# Patient Record
Sex: Female | Born: 1960
Health system: Southern US, Community
[De-identification: ages and names within clinical notes are randomized; demographics above are authoritative.]

## PROBLEM LIST (undated history)

## (undated) DIAGNOSIS — Z720 Tobacco use: Secondary | ICD-10-CM

## (undated) DIAGNOSIS — E785 Hyperlipidemia, unspecified: Secondary | ICD-10-CM

## (undated) DIAGNOSIS — E049 Nontoxic goiter, unspecified: Secondary | ICD-10-CM

## (undated) DIAGNOSIS — I10 Essential (primary) hypertension: Secondary | ICD-10-CM

## (undated) HISTORY — DX: Nontoxic goiter, unspecified: E04.9

## (undated) HISTORY — DX: Tobacco use: Z72.0

## (undated) HISTORY — DX: Morbid (severe) obesity due to excess calories: E66.01

## (undated) HISTORY — DX: Essential (primary) hypertension: I10

## (undated) HISTORY — PX: TUBAL LIGATION: SHX77

## (undated) HISTORY — DX: Hyperlipidemia, unspecified: E78.5

---

## 2019-01-10 DIAGNOSIS — I10 Essential (primary) hypertension: Secondary | ICD-10-CM | POA: Diagnosis not present

## 2019-01-10 DIAGNOSIS — M25571 Pain in right ankle and joints of right foot: Secondary | ICD-10-CM | POA: Diagnosis not present

## 2019-02-13 DIAGNOSIS — J0100 Acute maxillary sinusitis, unspecified: Secondary | ICD-10-CM | POA: Diagnosis not present

## 2019-05-15 DIAGNOSIS — Z91030 Bee allergy status: Secondary | ICD-10-CM | POA: Diagnosis not present

## 2019-05-15 DIAGNOSIS — Z23 Encounter for immunization: Secondary | ICD-10-CM | POA: Diagnosis not present

## 2019-05-15 DIAGNOSIS — I10 Essential (primary) hypertension: Secondary | ICD-10-CM | POA: Diagnosis not present

## 2019-05-15 DIAGNOSIS — E782 Mixed hyperlipidemia: Secondary | ICD-10-CM | POA: Diagnosis not present

## 2019-05-15 DIAGNOSIS — Z87898 Personal history of other specified conditions: Secondary | ICD-10-CM | POA: Diagnosis not present

## 2019-05-15 DIAGNOSIS — E041 Nontoxic single thyroid nodule: Secondary | ICD-10-CM | POA: Diagnosis not present

## 2019-05-29 DIAGNOSIS — E042 Nontoxic multinodular goiter: Secondary | ICD-10-CM | POA: Diagnosis not present

## 2019-06-07 DIAGNOSIS — Z1211 Encounter for screening for malignant neoplasm of colon: Secondary | ICD-10-CM | POA: Diagnosis not present

## 2019-06-07 DIAGNOSIS — Z01419 Encounter for gynecological examination (general) (routine) without abnormal findings: Secondary | ICD-10-CM | POA: Diagnosis not present

## 2019-09-04 DIAGNOSIS — Z117 Encounter for testing for latent tuberculosis infection: Secondary | ICD-10-CM | POA: Diagnosis not present

## 2019-09-04 DIAGNOSIS — E782 Mixed hyperlipidemia: Secondary | ICD-10-CM | POA: Diagnosis not present

## 2019-09-04 DIAGNOSIS — R7303 Prediabetes: Secondary | ICD-10-CM | POA: Diagnosis not present

## 2019-09-04 DIAGNOSIS — Z0000 Encounter for general adult medical examination without abnormal findings: Secondary | ICD-10-CM | POA: Diagnosis not present

## 2019-09-06 DIAGNOSIS — F17200 Nicotine dependence, unspecified, uncomplicated: Secondary | ICD-10-CM | POA: Diagnosis not present

## 2019-09-06 DIAGNOSIS — Z1231 Encounter for screening mammogram for malignant neoplasm of breast: Secondary | ICD-10-CM | POA: Diagnosis not present

## 2019-09-06 DIAGNOSIS — E2839 Other primary ovarian failure: Secondary | ICD-10-CM | POA: Diagnosis not present

## 2020-04-07 LAB — COLOGUARD: Cologuard: NEGATIVE

## 2020-05-08 ENCOUNTER — Other Ambulatory Visit: Payer: Self-pay

## 2020-05-08 ENCOUNTER — Encounter: Payer: Self-pay | Admitting: Internal Medicine

## 2020-05-08 ENCOUNTER — Ambulatory Visit (INDEPENDENT_AMBULATORY_CARE_PROVIDER_SITE_OTHER): Payer: BC Managed Care – PPO | Admitting: Internal Medicine

## 2020-05-08 ENCOUNTER — Encounter (INDEPENDENT_AMBULATORY_CARE_PROVIDER_SITE_OTHER): Payer: Self-pay

## 2020-05-08 VITALS — BP 110/84 | HR 81 | Temp 97.9°F | Ht 64.5 in | Wt 229.5 lb

## 2020-05-08 DIAGNOSIS — E785 Hyperlipidemia, unspecified: Secondary | ICD-10-CM

## 2020-05-08 DIAGNOSIS — E049 Nontoxic goiter, unspecified: Secondary | ICD-10-CM

## 2020-05-08 DIAGNOSIS — Z124 Encounter for screening for malignant neoplasm of cervix: Secondary | ICD-10-CM

## 2020-05-08 DIAGNOSIS — I10 Essential (primary) hypertension: Secondary | ICD-10-CM

## 2020-05-08 DIAGNOSIS — Z720 Tobacco use: Secondary | ICD-10-CM | POA: Diagnosis not present

## 2020-05-08 MED ORDER — BUPROPION HCL ER (XL) 150 MG PO TB24: 150.0000 mg | ORAL_TABLET | Freq: Every day | ORAL | 1 refills | Status: AC

## 2020-05-08 NOTE — Progress Notes (Signed)
New Patient Office Visit     This visit occurred during the SARS-CoV-2 public health emergency.  Safety protocols were in place, including screening questions prior to the visit, additional usage of staff PPE, and extensive cleaning of exam room while observing appropriate contact time as indicated for disinfecting solutions.    CC/Reason for Visit: Establish care, discuss chronic medical conditions Previous PCP: In [place], [place] Last Visit: November 2020  HPI: Christina Vaughan is a 59 y.o. female who is coming in today for the above mentioned reasons. Past Medical History is significant for: Hypertension, hyperlipidemia, ongoing tobacco abuse, thyroid goiter, vitamin D deficiency.  She recently moved from [facility] for job purposes.  She has 2 grown children.  She smokes about 10 cigarettes a day and has done so for around 10 years, drinks alcohol occasionally, no known drug allergies, her past surgical history is only significant for a tubal ligation.  Her family history is mostly significant for 5 siblings with thyroid cancer status post thyroidectomy.  Because of this history and her thyroid goiter she has annual ultrasounds which she is due for.  She also had a sister with breast cancer and another sister with lupus.  She has had all age-appropriate vaccinations.  She had a mammogram last October that was negative, she did a Cologuard earlier this year that was negative.  She is quite interested in smoking cessation today.   Past Medical/Surgical History: Past Medical History:  Diagnosis Date  . Hyperlipidemia   . Hypertension   . Morbid obesity (HCC)   . Thyroid goiter   . Tobacco abuse     Past Surgical History:  Procedure Laterality Date  . TUBAL LIGATION      Social History:  reports that she has been smoking. She has never used smokeless tobacco. She reports current alcohol use. She reports that she does not use drugs.  Allergies: No Known Allergies  Family  History:  Please see HPI for details.  Current Outpatient Medications:  .  ascorbic acid (VITAMIN C) 1000 MG tablet, Take by mouth., Disp: , Rfl:  .  Cholecalciferol 1.25 MG (50000 UT) capsule, cholecalciferol (vitamin D3) 1,250 mcg (50,000 unit) capsule  TK 1 C PO WEEKLY FOR 8 WKS, Disp: , Rfl:  .  Cyanocobalamin 5000 MCG TBDP, Take by mouth., Disp: , Rfl:  .  EPINEPHrine 0.3 mg/0.3 mL IJ SOAJ injection, epinephrine 0.3 mg/0.3 mL injection, auto-injector  INJECT 0.3 ML INTO THE MUSCLE ONCE AS NEEDED FOR ANAPHYLAXIS., Disp: , Rfl:  .  fluticasone (FLONASE) 50 MCG/ACT nasal spray, fluticasone propionate 50 mcg/actuation nasal spray,suspension  SHAKE LIQUID AND USE 1 SPRAY IN EACH NOSTRIL EVERY DAY AS NEEDED, Disp: , Rfl:  .  Ginger, Zingiber officinalis, (GINGER PO), Take by mouth., Disp: , Rfl:  .  loratadine (CLARITIN) 10 MG tablet, loratadine 10 mg tablet, Disp: , Rfl:  .  OIL OF OREGANO PO, Take by mouth., Disp: , Rfl:  .  pravastatin (PRAVACHOL) 20 MG tablet, Take 20 mg by mouth at bedtime., Disp: , Rfl:  .  telmisartan-hydrochlorothiazide (MICARDIS HCT) 80-25 MG tablet, telmisartan 80 mg-hydrochlorothiazide 25 mg tablet  TAKE 1 TABLET BY MOUTH DAILY, Disp: , Rfl:  .  TURMERIC PO, Take by mouth., Disp: , Rfl:  .  Zinc 100 MG TABS, Take by mouth., Disp: , Rfl:  .  buPROPion (WELLBUTRIN XL) 150 MG 24 hr tablet, Take 1 tablet (150 mg total) by mouth daily., Disp: 90 tablet, Rfl: 1  Review of Systems:  Constitutional: Denies fever, chills, diaphoresis, appetite change and fatigue.  HEENT: Denies photophobia, eye pain, redness, hearing loss, ear pain, congestion, sore throat, rhinorrhea, sneezing, mouth sores, trouble swallowing, neck pain, neck stiffness and tinnitus.   Respiratory: Denies SOB, DOE, cough, chest tightness,  and wheezing.   Cardiovascular: Denies chest pain, palpitations and leg swelling.  Gastrointestinal: Denies nausea, vomiting, abdominal pain, diarrhea, constipation, blood  in stool and abdominal distention.  Genitourinary: Denies dysuria, urgency, frequency, hematuria, flank pain and difficulty urinating.  Endocrine: Denies: hot or cold intolerance, sweats, changes in hair or nails, polyuria, polydipsia. Musculoskeletal: Denies myalgias, back pain, joint swelling, arthralgias and gait problem.  Skin: Denies pallor, rash and wound.  Neurological: Denies dizziness, seizures, syncope, weakness, light-headedness, numbness and headaches.  Hematological: Denies adenopathy. Easy bruising, personal or family bleeding history  Psychiatric/Behavioral: Denies suicidal ideation, mood changes, confusion, nervousness, sleep disturbance and agitation    Physical Exam: Vitals:   05/08/20 0807  BP: 110/84  Pulse: 81  Temp: 97.9 F (36.6 C)  TempSrc: Oral  SpO2: 97%  Weight: 229 lb 8 oz (104.1 kg)  Height: 5' 4.5" (1.638 m)   Body mass index is 38.79 kg/m.  Constitutional: NAD, calm, comfortable, obese Eyes: PERRL, lids and conjunctivae normal, wears corrective lenses ENMT: Mucous membranes are moist.  Respiratory: clear to auscultation bilaterally, no wheezing, no crackles. Normal respiratory effort. No accessory muscle use.  Cardiovascular: Regular rate and rhythm, no murmurs / rubs / gallops. No extremity edema. .  Neurologic: Grossly intact and nonfocal Psychiatric: Normal judgment and insight. Alert and oriented x 3. Normal mood.    Impression and Plan:  Cervical cancer screening  - Plan: Ambulatory referral to Obstetrics / Gynecology  Essential hypertension -Well-controlled on current medication.  Hyperlipidemia, unspecified hyperlipidemia type -On statin, check lipids when she returns for CPE.  Thyroid goiter  - Plan: US THYROID -Impressive thyroid cancer history in her family.  Morbid obesity (HCC) -Discussed healthy lifestyle, including increased physical activity and better food choices to promote weight loss.  Tobacco abuse  -I have  discussed tobacco cessation with the patient.  I have counseled the patient regarding the negative impacts of continued tobacco use including but not limited to lung cancer, COPD, and cardiovascular disease.  I have discussed alternatives to tobacco and modalities that may help facilitate tobacco cessation including but not limited to biofeedback, hypnosis, and medications.  Total time spent with tobacco counseling was 6 minutes. -She is in the action phase.  She would like to try Wellbutrin.  She will also schedule CBT sessions and consider hypnosis. -She will follow-up with me in 6 weeks.     Patient Instructions  -Nice seeing you today!!  -Start wellbutrin 150 mg daily.  -Schedule follow up in 6 weeks.  -Consider therapy sessions.       Chaya Jan, MD [facility]

## 2020-05-08 NOTE — Patient Instructions (Signed)
-  Nice seeing you today!!  -Start wellbutrin 150 mg daily.  -Schedule follow up in 6 weeks.  -Consider therapy sessions.

## 2020-06-06 ENCOUNTER — Other Ambulatory Visit: Payer: Self-pay

## 2020-06-06 ENCOUNTER — Ambulatory Visit: Payer: BC Managed Care – PPO | Admitting: Nurse Practitioner

## 2020-06-06 ENCOUNTER — Encounter: Payer: Self-pay | Admitting: Nurse Practitioner

## 2020-06-06 VITALS — BP 110/82 | Ht 64.0 in | Wt 229.0 lb

## 2020-06-06 DIAGNOSIS — Z780 Asymptomatic menopausal state: Secondary | ICD-10-CM

## 2020-06-06 DIAGNOSIS — Z01419 Encounter for gynecological examination (general) (routine) without abnormal findings: Secondary | ICD-10-CM | POA: Diagnosis not present

## 2020-06-06 DIAGNOSIS — Z9851 Tubal ligation status: Secondary | ICD-10-CM | POA: Insufficient documentation

## 2020-06-06 NOTE — Progress Notes (Signed)
   Christina Vaughan. Novosel 1961-02-03 [id number]   History:  59 y.o. [id number] presents as new patient to establish care without GYN complaints. BTL. Postmenopausal - no HRT, no bleeding. Abnormal pap many years ago, cryo done, subsequent normal. Normal mammogram history. Significant family history of thyroid cancer, ultrasound performed every 2 years. Not sexually active. Sister diagnosed with breast cancer 1.5 years ago.   Gynecologic History No LMP recorded. Patient is postmenopausal.   Last Pap: 06/2019. Results were: normal Last mammogram: 07/2019. Results were: normal Last colonoscopy: Never. Negative cologuard 2021 Last Dexa: 09/06/2019. Results were: normal  Past medical history, past surgical history, family history and social history were all reviewed and documented in the EPIC chart.  ROS:  A ROS was performed and pertinent positives and negatives are included.  Exam:  Vitals:   06/06/20 0904  BP: 110/82  Weight: 229 lb (103.9 kg)  Height: 5\' 4"  (1.626 m)   Body mass index is 39.31 kg/m.  General appearance:  Normal Thyroid:  Symmetrical, normal in size, without palpable masses or nodularity. Respiratory  Auscultation:  Clear without wheezing or rhonchi Cardiovascular  Auscultation:  Regular rate, without rubs, murmurs or gallops  Edema/varicosities:  Not grossly evident Abdominal  Soft,nontender, without masses, guarding or rebound.  Liver/spleen:  No organomegaly noted  Hernia:  None appreciated  Skin  Inspection:  Grossly normal   Breasts: Examined lying and sitting.   Right: Without masses, retractions, discharge or axillary adenopathy.   Left: Without masses, retractions, discharge or axillary adenopathy. Gentitourinary   Inguinal/mons:  Normal without inguinal adenopathy  External genitalia:  Normal  BUS/Urethra/Skene's glands:  Normal  Vagina:  Normal  Cervix:  Normal  Uterus:  Difficult to palpate due to body habitus but no gross masses or  tenderness  Adnexa/parametria:     Rt: Without masses or tenderness.   Lt: Without masses or tenderness.  Anus and perineum: Normal  Digital rectal exam: Normal sphincter tone without palpated masses or tenderness  Assessment/Plan:  59 y.o. 66 to establish care.   Well female exam with routine gynecological exam - Education provided on SBEs, importance of preventative screenings, current guidelines, high calcium diet, regular exercise, and multivitamin daily. Labs done with primary care.   Postmenopausal - no HRT, no bleeding.   Screening for breast cancer - Normal mammogram history. Most recent October 2020. Sister diagnosed with breast cancer 1.5 years ago.   Follow up in 1 year for annual       November 2020 [facility], 9:13 AM 06/06/2020

## 2020-06-06 NOTE — Patient Instructions (Signed)

## 2020-06-15 ENCOUNTER — Ambulatory Visit (HOSPITAL_COMMUNITY)
Admission: EM | Admit: 2020-06-15 | Discharge: 2020-06-15 | Disposition: A | Discharge status: Home / Self Care | Payer: BC Managed Care – PPO | Attending: Family Medicine | Admitting: Family Medicine

## 2020-06-15 ENCOUNTER — Ambulatory Visit (INDEPENDENT_AMBULATORY_CARE_PROVIDER_SITE_OTHER): Payer: BC Managed Care – PPO

## 2020-06-15 ENCOUNTER — Other Ambulatory Visit: Payer: Self-pay

## 2020-06-15 ENCOUNTER — Encounter (HOSPITAL_COMMUNITY): Payer: Self-pay

## 2020-06-15 DIAGNOSIS — S4991XA Unspecified injury of right shoulder and upper arm, initial encounter: Secondary | ICD-10-CM

## 2020-06-15 DIAGNOSIS — M25511 Pain in right shoulder: Secondary | ICD-10-CM | POA: Diagnosis not present

## 2020-06-15 DIAGNOSIS — M19011 Primary osteoarthritis, right shoulder: Secondary | ICD-10-CM | POA: Diagnosis not present

## 2020-06-15 MED ORDER — KETOROLAC TROMETHAMINE 60 MG/2ML IM SOLN
60.0000 mg | Freq: Once | INTRAMUSCULAR | Status: AC
  Administered 2020-06-15: 60 mg via INTRAMUSCULAR

## 2020-06-15 MED ORDER — KETOROLAC TROMETHAMINE 60 MG/2ML IM SOLN
INTRAMUSCULAR | Status: AC
  Filled 2020-06-15: qty 2

## 2020-06-15 NOTE — Discharge Instructions (Signed)
May use voltaren gel and tylenol as needed for pain relief. Follow up with Orthopedics in about a week  [facility] [phone]
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
## 2020-06-18 ENCOUNTER — Other Ambulatory Visit: Payer: Self-pay

## 2020-06-19 ENCOUNTER — Encounter: Payer: Self-pay | Admitting: Internal Medicine

## 2020-06-19 ENCOUNTER — Ambulatory Visit: Payer: BC Managed Care – PPO | Admitting: Internal Medicine

## 2020-06-19 VITALS — BP 120/84 | HR 78 | Temp 98.2°F | Wt 232.0 lb

## 2020-06-19 DIAGNOSIS — I10 Essential (primary) hypertension: Secondary | ICD-10-CM

## 2020-06-19 DIAGNOSIS — Z23 Encounter for immunization: Secondary | ICD-10-CM

## 2020-06-19 DIAGNOSIS — M79601 Pain in right arm: Secondary | ICD-10-CM | POA: Diagnosis not present

## 2020-06-19 DIAGNOSIS — Z720 Tobacco use: Secondary | ICD-10-CM

## 2020-06-19 NOTE — Addendum Note (Signed)
Addended by: Kern Reap B on: 06/19/2020 05:26 PM   Modules accepted: Orders

## 2020-06-19 NOTE — Patient Instructions (Signed)
-  Nice seeing you today!!  -Resume Wellbutrin 150 mg daily.  -We will schedule you to see an orthopedist for your arm.  -Schedule follow up in 3 months.
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
## 2020-06-20 ENCOUNTER — Ambulatory Visit (INDEPENDENT_AMBULATORY_CARE_PROVIDER_SITE_OTHER): Payer: BC Managed Care – PPO | Admitting: Orthopaedic Surgery

## 2020-06-20 ENCOUNTER — Other Ambulatory Visit: Payer: Self-pay

## 2020-06-20 ENCOUNTER — Encounter: Payer: Self-pay | Admitting: Orthopaedic Surgery

## 2020-06-20 DIAGNOSIS — M25511 Pain in right shoulder: Secondary | ICD-10-CM | POA: Diagnosis not present

## 2020-06-20 MED ORDER — TRAMADOL HCL 50 MG PO TABS: ORAL_TABLET | ORAL | 0 refills | Status: AC
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
## 2020-06-21 ENCOUNTER — Other Ambulatory Visit: Payer: BC Managed Care – PPO

## 2020-06-24 ENCOUNTER — Telehealth: Payer: Self-pay

## 2020-06-24 NOTE — Telephone Encounter (Signed)
Please see below.

## 2020-06-24 NOTE — Telephone Encounter (Signed)
Patient called in advising her mri appt was canceled because facility never received ref from [place] .

## 2020-06-26 NOTE — Telephone Encounter (Signed)
Refaxed referral to [facility], they will contact pt to schedule appt

## 2020-07-01 ENCOUNTER — Encounter: Payer: Self-pay | Admitting: Orthopaedic Surgery

## 2020-07-01 ENCOUNTER — Encounter: Payer: Self-pay | Admitting: Internal Medicine

## 2020-07-02 ENCOUNTER — Ambulatory Visit
Admission: RE | Admit: 2020-07-02 | Discharge: 2020-07-02 | Disposition: A | Discharge status: Home / Self Care | Payer: BC Managed Care – PPO | Source: Ambulatory Visit | Attending: Orthopaedic Surgery | Admitting: Orthopaedic Surgery

## 2020-07-02 DIAGNOSIS — M25511 Pain in right shoulder: Secondary | ICD-10-CM | POA: Diagnosis not present

## 2020-07-02 NOTE — Telephone Encounter (Signed)
Pt is scheduled for later today for MRI

## 2020-07-09 ENCOUNTER — Encounter: Payer: Self-pay | Admitting: Orthopaedic Surgery

## 2020-07-09 ENCOUNTER — Ambulatory Visit: Payer: BC Managed Care – PPO | Admitting: Orthopaedic Surgery

## 2020-07-09 ENCOUNTER — Other Ambulatory Visit: Payer: Self-pay

## 2020-07-09 VITALS — Ht 64.0 in | Wt 232.0 lb

## 2020-07-09 DIAGNOSIS — M25511 Pain in right shoulder: Secondary | ICD-10-CM

## 2020-07-09 NOTE — Progress Notes (Addendum)
Office Visit Note   Patient: Christina Vaughan           Date of Birth: 02-14-1961           MRN: [id number] Visit Date: 07/09/2020              Requested by: Philip Aspen, Limmie Patricia, MD [address] [place],  [place] [address] PCP: Philip Aspen, Limmie Patricia, MD   Assessment & Plan: Visit Diagnoses:  1. Acute pain of right shoulder     Plan: Ms. Balliet is about 1 month post injury to her right shoulder.  Because of her discomfort I ordered an MRI scan and she is here for the results.  The scan demonstrated completely torn supra and infraspinatus with about 2 to 3 cm of retraction.  Muscles were normal without atrophy or focal lesions.  There was tendinopathy of the intra-articular segment of the biceps long head.  Moderate osteoarthritis of the AC joint and a type II acromium.  There was fluid in the subacromial and subdeltoid bursa.  Glenohumeral joint was negative.  Superior labrum was frayed and degenerative without a discrete tear.  No bony abnormalities or contusions.  Long discussion with Mrs. Aispuro regarding her scan.  She is feeling some pain in the mid humerus with shoulder motion and I really believe that the problem with her mid arm is referred from the shoulder.  She does have limited range of motion and pain with motion.  I think the best approach would be an arthroscopic SCD, DCR and mini open rotator cuff tear repair and possibly a biceps tenodesis and possibly a derma span patch.  I discussed the surgery with her including the outpatient nature of the incisions and their potential success in terms of pain relief and function.  I am concerned because both infra and supraspinatus tendons are torn with some retraction.  Hopefully we can retrieve them and after rehab reattach them.  She may have less than ideal function afterwards pending the findings and subsequent results.  She might have an early adhesive capsulitis which we can determine at the time of surgery.  She  does smoke and have encouraged her to a "cut back".  We will plan to do the surgery sometime this month as I had like to avoid contraction of the tendons.  All questions were answered.  Follow-Up Instructions: Return We will schedule rotator cuff tear repair right shoulder.   Orders:  No orders of the defined types were placed in this encounter.  No orders of the defined types were placed in this encounter.     Procedures: No procedures performed   Clinical Data: No additional findings.   Subjective: Chief Complaint  Patient presents with  . Right Shoulder - Follow-up    MRI review  Patient presents today for follow up on her right shoulder. She had an MRI done on 07/02/2020 and is here today for those results.  Approximately a month post injury to the right shoulder  HPI  Review of Systems   Objective: Vital Signs: Ht 5\' 4"  (1.626 m)   Wt 232 lb (105.2 kg)   BMI 39.82 kg/m   Physical Exam Constitutional:      Appearance: She is well-developed.  Eyes:     Pupils: Pupils are equal, round, and reactive to light.  Pulmonary:     Effort: Pulmonary effort is normal.  Skin:    General: Skin is warm and dry.  Neurological:  Mental Status: She is alert and oriented to person, place, and time.  Psychiatric:        Behavior: Behavior normal.     Ortho Exam awake alert and oriented x3.  Comfortable sitting.  No use of sling.  Having difficulty with active motion of her right shoulder with only about 70 degrees of active flexion and abduction.  I could bring her arm over her head but still lacking about 30 to 40 degrees of full overhead motion.  I am not sure if she was limited by pain or if she has developed an early adhesive capsulitis.  She did have positive empty can testing and impingement testing.  Skin was intact.  Biceps tendon appears to be intact.  She has been experiencing some popping and clicking but I could not reproduce that.  There was some tenderness in the  anterior subacromial region even laterally.  Certainly has pain referred to the mid arm with motion of her shoulder.  Good grip and release  Specialty Comments:  No specialty comments available.  Imaging: No results found.   PMFS History: Patient Active Problem List   Diagnosis Date Noted  . Pain in right shoulder 06/20/2020  . History of bilateral tubal ligation 06/06/2020  . Hypertension   . Hyperlipidemia   . Thyroid goiter   . Morbid obesity (HCC)   . Tobacco abuse    Past Medical History:  Diagnosis Date  . Hyperlipidemia   . Hypertension   . Morbid obesity (HCC)   . Thyroid goiter   . Tobacco abuse     Family History  Problem Relation Age of Onset  . Cancer Father     Past Surgical History:  Procedure Laterality Date  . TUBAL LIGATION     Social History   Occupational History  . Not on file  Tobacco Use  . Smoking status: Current Every Day Smoker    Packs/day: 0.25  . Smokeless tobacco: Never Used  Vaping Use  . Vaping Use: Never used  Substance and Sexual Activity  . Alcohol use: Yes    Comment: occasional  . Drug use: Never  . Sexual activity: Not Currently

## 2020-07-22 ENCOUNTER — Encounter: Payer: Self-pay | Admitting: Orthopaedic Surgery

## 2020-07-22 ENCOUNTER — Encounter: Payer: Self-pay | Admitting: Internal Medicine

## 2020-07-22 DIAGNOSIS — M79646 Pain in unspecified finger(s): Secondary | ICD-10-CM

## 2020-07-23 ENCOUNTER — Telehealth (INDEPENDENT_AMBULATORY_CARE_PROVIDER_SITE_OTHER): Payer: BC Managed Care – PPO | Admitting: Internal Medicine

## 2020-07-23 ENCOUNTER — Encounter: Payer: Self-pay | Admitting: Internal Medicine

## 2020-07-23 DIAGNOSIS — M25511 Pain in right shoulder: Secondary | ICD-10-CM

## 2020-07-23 DIAGNOSIS — E049 Nontoxic goiter, unspecified: Secondary | ICD-10-CM

## 2020-07-23 DIAGNOSIS — Z720 Tobacco use: Secondary | ICD-10-CM

## 2020-07-23 NOTE — Progress Notes (Signed)
Virtual Visit via Video Note  I connected with Christina Ramming. Vaughan on 07/23/20 at  3:45 PM EDT by a video enabled telemedicine application and verified that I am speaking with the correct person using two identifiers.  Location patient: home Location provider: work office Persons participating in the virtual visit: patient, provider  I discussed the limitations of evaluation and management by telemedicine and the availability of in person appointments. The patient expressed understanding and agreed to proceed.   HPI: She has scheduled this visit to update me on her orthopedic issues as well as to discuss smoking cessation.  She saw orthopedics and had an MRI and was diagnosed with some right rotator cuff issues.  She has an appointment scheduled for surgery coming soon but she has been somewhat unhappy with communication with the orthopedist and was requesting a referral to another practice for second opinion.  She has already scheduled this appointment.  We also discussed smoking cessation.  At last visit she was started on Wellbutrin 150 mg daily.  She has been able to cut down to 1 to 2 cigarettes a week.  She has learned to recognize social and environmental triggers and can manage these most of the time.  She feels like she is tolerating the Wellbutrin well.   ROS: Constitutional: Denies fever, chills, diaphoresis, appetite change and fatigue.  HEENT: Denies photophobia, eye pain, redness, hearing loss, ear pain, congestion, sore throat, rhinorrhea, sneezing, mouth sores, trouble swallowing, neck pain, neck stiffness and tinnitus.   Respiratory: Denies SOB, DOE, cough, chest tightness,  and wheezing.   Cardiovascular: Denies chest pain, palpitations and leg swelling.  Gastrointestinal: Denies nausea, vomiting, abdominal pain, diarrhea, constipation, blood in stool and abdominal distention.  Genitourinary: Denies dysuria, urgency, frequency, hematuria, flank pain and difficulty  urinating.  Endocrine: Denies: hot or cold intolerance, sweats, changes in hair or nails, polyuria, polydipsia. Musculoskeletal: Denies myalgias, back pain, joint swelling, arthralgias and gait problem.  Skin: Denies pallor, rash and wound.  Neurological: Denies dizziness, seizures, syncope, weakness, light-headedness, numbness and headaches.  Hematological: Denies adenopathy. Easy bruising, personal or family bleeding history  Psychiatric/Behavioral: Denies suicidal ideation, mood changes, confusion, nervousness, sleep disturbance and agitation   Past Medical History:  Diagnosis Date  . Hyperlipidemia   . Hypertension   . Morbid obesity (HCC)   . Thyroid goiter   . Tobacco abuse     Past Surgical History:  Procedure Laterality Date  . TUBAL LIGATION      Family History  Problem Relation Age of Onset  . Cancer Father     SOCIAL HX:   reports that she has been smoking. She has been smoking about 0.25 packs per day. She has never used smokeless tobacco. She reports current alcohol use. She reports that she does not use drugs.   Current Outpatient Medications:  .  ascorbic acid (VITAMIN C) 1000 MG tablet, Take by mouth., Disp: , Rfl:  .  buPROPion (WELLBUTRIN XL) 150 MG 24 hr tablet, Take 1 tablet (150 mg total) by mouth daily., Disp: 90 tablet, Rfl: 1 .  Cholecalciferol 1.25 MG (50000 UT) capsule, cholecalciferol (vitamin D3) 1,250 mcg (50,000 unit) capsule  TK 1 C PO WEEKLY FOR 8 WKS, Disp: , Rfl:  .  Cyanocobalamin 5000 MCG TBDP, Take by mouth., Disp: , Rfl:  .  EPINEPHrine 0.3 mg/0.3 mL IJ SOAJ injection, epinephrine 0.3 mg/0.3 mL injection, auto-injector  INJECT 0.3 ML INTO THE MUSCLE ONCE AS NEEDED FOR ANAPHYLAXIS., Disp: , Rfl:  .  fluticasone (FLONASE) 50 MCG/ACT nasal spray, fluticasone propionate 50 mcg/actuation nasal spray,suspension  SHAKE LIQUID AND USE 1 SPRAY IN EACH NOSTRIL EVERY DAY AS NEEDED, Disp: , Rfl:  .  Ginger, Zingiber officinalis, (GINGER PO), Take by  mouth., Disp: , Rfl:  .  loratadine (CLARITIN) 10 MG tablet, loratadine 10 mg tablet, Disp: , Rfl:  .  OIL OF OREGANO PO, Take by mouth., Disp: , Rfl:  .  pravastatin (PRAVACHOL) 20 MG tablet, Take 20 mg by mouth at bedtime., Disp: , Rfl:  .  telmisartan-hydrochlorothiazide (MICARDIS HCT) 80-25 MG tablet, telmisartan 80 mg-hydrochlorothiazide 25 mg tablet  TAKE 1 TABLET BY MOUTH DAILY, Disp: , Rfl:  .  traMADol (ULTRAM) 50 MG tablet, Take 2 tablets by mouth q HS prn., Disp: 30 tablet, Rfl: 0 .  TURMERIC PO, Take by mouth., Disp: , Rfl:  .  Zinc 100 MG TABS, Take by mouth., Disp: , Rfl:   EXAM:   VITALS per patient if applicable: None reported  GENERAL: alert, oriented, appears well and in no acute distress  HEENT: atraumatic, conjunttiva clear, no obvious abnormalities on inspection of external nose and ears  NECK: normal movements of the head and neck  LUNGS: on inspection no signs of respiratory distress, breathing rate appears normal, no obvious gross increased work of breathing, gasping or wheezing  CV: no obvious cyanosis  MS: moves all visible extremities without noticeable abnormality  PSYCH/NEURO: pleasant and cooperative, no obvious depression or anxiety, speech and thought processing grossly intact  ASSESSMENT AND PLAN:   Acute pain of right shoulder -She will update me after she gets her second opinion in regards to her surgery. -I will defer to orthopedics in regards to this issue.  Thyroid goiter -Her thyroid ultrasound is coming up soon.  She has yearly ultrasounds due to her goiter and multiple family members with thyroid cancer.  Tobacco abuse -She continues on Wellbutrin, she is tolerating it well, she has been able to cut down to 1 to 2 cigarettes a week.  Have encouraged her on her progress.     I discussed the assessment and treatment plan with the patient. The patient was provided an opportunity to ask questions and all were answered. The patient agreed  with the plan and demonstrated an understanding of the instructions.   The patient was advised to call back or seek an in-person evaluation if the symptoms worsen or if the condition fails to improve as anticipated.    Chaya Jan, MD  [facility]

## 2020-07-26 ENCOUNTER — Encounter: Payer: Self-pay | Admitting: Orthopaedic Surgery

## 2020-07-26 DIAGNOSIS — M75121 Complete rotator cuff tear or rupture of right shoulder, not specified as traumatic: Secondary | ICD-10-CM | POA: Diagnosis not present

## 2020-07-26 DIAGNOSIS — M65311 Trigger thumb, right thumb: Secondary | ICD-10-CM | POA: Diagnosis not present

## 2020-07-26 NOTE — Telephone Encounter (Signed)
Sent note

## 2020-07-29 ENCOUNTER — Ambulatory Visit
Admission: RE | Admit: 2020-07-29 | Discharge: 2020-07-29 | Disposition: A | Discharge status: Home / Self Care | Payer: BC Managed Care – PPO | Source: Ambulatory Visit | Attending: Internal Medicine | Admitting: Internal Medicine

## 2020-07-29 DIAGNOSIS — E049 Nontoxic goiter, unspecified: Secondary | ICD-10-CM

## 2020-08-06 DIAGNOSIS — M24111 Other articular cartilage disorders, right shoulder: Secondary | ICD-10-CM | POA: Diagnosis not present

## 2020-08-06 DIAGNOSIS — Y999 Unspecified external cause status: Secondary | ICD-10-CM | POA: Diagnosis not present

## 2020-08-06 DIAGNOSIS — X58XXXA Exposure to other specified factors, initial encounter: Secondary | ICD-10-CM | POA: Diagnosis not present

## 2020-08-06 DIAGNOSIS — M7541 Impingement syndrome of right shoulder: Secondary | ICD-10-CM | POA: Diagnosis not present

## 2020-08-06 DIAGNOSIS — S46011A Strain of muscle(s) and tendon(s) of the rotator cuff of right shoulder, initial encounter: Secondary | ICD-10-CM | POA: Diagnosis not present

## 2020-08-06 DIAGNOSIS — G8918 Other acute postprocedural pain: Secondary | ICD-10-CM | POA: Diagnosis not present

## 2020-08-08 ENCOUNTER — Inpatient Hospital Stay: Payer: BC Managed Care – PPO | Admitting: Orthopaedic Surgery

## 2020-08-14 ENCOUNTER — Telehealth: Payer: Self-pay | Admitting: Internal Medicine

## 2020-08-14 DIAGNOSIS — Z9889 Other specified postprocedural states: Secondary | ICD-10-CM | POA: Diagnosis not present

## 2020-08-14 DIAGNOSIS — R531 Weakness: Secondary | ICD-10-CM | POA: Diagnosis not present

## 2020-08-14 DIAGNOSIS — M25511 Pain in right shoulder: Secondary | ICD-10-CM | POA: Diagnosis not present

## 2020-08-14 DIAGNOSIS — M25311 Other instability, right shoulder: Secondary | ICD-10-CM | POA: Diagnosis not present

## 2020-08-14 NOTE — Telephone Encounter (Signed)
Pt is calling in stating that her surgery on her R shoulder at [facility] by a [facility] Surgeon gave her a prescription to have PT done and she is calling the office to see if she needs a additional referral from our office.  Pt is aware that we do not need to do a referral for her but she stated she wants to make sure before she goes over there to start PT.  Pt was transferred to [facility] to explain if she needs a referral from [place].

## 2020-08-16 DIAGNOSIS — R531 Weakness: Secondary | ICD-10-CM | POA: Diagnosis not present

## 2020-08-16 DIAGNOSIS — Z9889 Other specified postprocedural states: Secondary | ICD-10-CM | POA: Diagnosis not present

## 2020-08-16 DIAGNOSIS — M25311 Other instability, right shoulder: Secondary | ICD-10-CM | POA: Diagnosis not present

## 2020-08-16 DIAGNOSIS — M25511 Pain in right shoulder: Secondary | ICD-10-CM | POA: Diagnosis not present

## 2020-08-19 DIAGNOSIS — M25511 Pain in right shoulder: Secondary | ICD-10-CM | POA: Diagnosis not present

## 2020-08-19 DIAGNOSIS — Z9889 Other specified postprocedural states: Secondary | ICD-10-CM | POA: Diagnosis not present

## 2020-08-19 DIAGNOSIS — R531 Weakness: Secondary | ICD-10-CM | POA: Diagnosis not present

## 2020-08-19 DIAGNOSIS — M25311 Other instability, right shoulder: Secondary | ICD-10-CM | POA: Diagnosis not present

## 2020-08-20 DIAGNOSIS — R531 Weakness: Secondary | ICD-10-CM | POA: Diagnosis not present

## 2020-08-20 DIAGNOSIS — Z9889 Other specified postprocedural states: Secondary | ICD-10-CM | POA: Diagnosis not present

## 2020-08-20 DIAGNOSIS — M25311 Other instability, right shoulder: Secondary | ICD-10-CM | POA: Diagnosis not present

## 2020-08-20 DIAGNOSIS — M25511 Pain in right shoulder: Secondary | ICD-10-CM | POA: Diagnosis not present

## 2020-08-22 DIAGNOSIS — R531 Weakness: Secondary | ICD-10-CM | POA: Diagnosis not present

## 2020-08-22 DIAGNOSIS — M25311 Other instability, right shoulder: Secondary | ICD-10-CM | POA: Diagnosis not present

## 2020-08-22 DIAGNOSIS — M25511 Pain in right shoulder: Secondary | ICD-10-CM | POA: Diagnosis not present

## 2020-08-22 DIAGNOSIS — Z9889 Other specified postprocedural states: Secondary | ICD-10-CM | POA: Diagnosis not present

## 2020-08-26 DIAGNOSIS — M25311 Other instability, right shoulder: Secondary | ICD-10-CM | POA: Diagnosis not present

## 2020-08-26 DIAGNOSIS — Z9889 Other specified postprocedural states: Secondary | ICD-10-CM | POA: Diagnosis not present

## 2020-08-26 DIAGNOSIS — R531 Weakness: Secondary | ICD-10-CM | POA: Diagnosis not present

## 2020-08-26 DIAGNOSIS — M25511 Pain in right shoulder: Secondary | ICD-10-CM | POA: Diagnosis not present

## 2020-08-28 DIAGNOSIS — R531 Weakness: Secondary | ICD-10-CM | POA: Diagnosis not present

## 2020-08-28 DIAGNOSIS — Z9889 Other specified postprocedural states: Secondary | ICD-10-CM | POA: Diagnosis not present

## 2020-08-28 DIAGNOSIS — M25511 Pain in right shoulder: Secondary | ICD-10-CM | POA: Diagnosis not present

## 2020-08-28 DIAGNOSIS — M25311 Other instability, right shoulder: Secondary | ICD-10-CM | POA: Diagnosis not present

## 2020-09-02 DIAGNOSIS — M25311 Other instability, right shoulder: Secondary | ICD-10-CM | POA: Diagnosis not present

## 2020-09-02 DIAGNOSIS — R531 Weakness: Secondary | ICD-10-CM | POA: Diagnosis not present

## 2020-09-02 DIAGNOSIS — Z9889 Other specified postprocedural states: Secondary | ICD-10-CM | POA: Diagnosis not present

## 2020-09-02 DIAGNOSIS — M25511 Pain in right shoulder: Secondary | ICD-10-CM | POA: Diagnosis not present

## 2020-09-04 DIAGNOSIS — M25511 Pain in right shoulder: Secondary | ICD-10-CM | POA: Diagnosis not present

## 2020-09-04 DIAGNOSIS — M25311 Other instability, right shoulder: Secondary | ICD-10-CM | POA: Diagnosis not present

## 2020-09-04 DIAGNOSIS — R531 Weakness: Secondary | ICD-10-CM | POA: Diagnosis not present

## 2020-09-04 DIAGNOSIS — Z9889 Other specified postprocedural states: Secondary | ICD-10-CM | POA: Diagnosis not present

## 2020-09-06 DIAGNOSIS — R531 Weakness: Secondary | ICD-10-CM | POA: Diagnosis not present

## 2020-09-06 DIAGNOSIS — M25511 Pain in right shoulder: Secondary | ICD-10-CM | POA: Diagnosis not present

## 2020-09-06 DIAGNOSIS — Z9889 Other specified postprocedural states: Secondary | ICD-10-CM | POA: Diagnosis not present

## 2020-09-06 DIAGNOSIS — M25311 Other instability, right shoulder: Secondary | ICD-10-CM | POA: Diagnosis not present

## 2020-09-09 DIAGNOSIS — R531 Weakness: Secondary | ICD-10-CM | POA: Diagnosis not present

## 2020-09-09 DIAGNOSIS — M25311 Other instability, right shoulder: Secondary | ICD-10-CM | POA: Diagnosis not present

## 2020-09-09 DIAGNOSIS — M25511 Pain in right shoulder: Secondary | ICD-10-CM | POA: Diagnosis not present

## 2020-09-09 DIAGNOSIS — Z9889 Other specified postprocedural states: Secondary | ICD-10-CM | POA: Diagnosis not present

## 2020-09-11 DIAGNOSIS — M25311 Other instability, right shoulder: Secondary | ICD-10-CM | POA: Diagnosis not present

## 2020-09-11 DIAGNOSIS — M25511 Pain in right shoulder: Secondary | ICD-10-CM | POA: Diagnosis not present

## 2020-09-11 DIAGNOSIS — R531 Weakness: Secondary | ICD-10-CM | POA: Diagnosis not present

## 2020-09-11 DIAGNOSIS — Z9889 Other specified postprocedural states: Secondary | ICD-10-CM | POA: Diagnosis not present

## 2020-09-13 DIAGNOSIS — M25311 Other instability, right shoulder: Secondary | ICD-10-CM | POA: Diagnosis not present

## 2020-09-13 DIAGNOSIS — Z9889 Other specified postprocedural states: Secondary | ICD-10-CM | POA: Diagnosis not present

## 2020-09-13 DIAGNOSIS — M25511 Pain in right shoulder: Secondary | ICD-10-CM | POA: Diagnosis not present

## 2020-09-13 DIAGNOSIS — R531 Weakness: Secondary | ICD-10-CM | POA: Diagnosis not present

## 2020-09-16 DIAGNOSIS — M25311 Other instability, right shoulder: Secondary | ICD-10-CM | POA: Diagnosis not present

## 2020-09-16 DIAGNOSIS — Z9889 Other specified postprocedural states: Secondary | ICD-10-CM | POA: Diagnosis not present

## 2020-09-16 DIAGNOSIS — M25511 Pain in right shoulder: Secondary | ICD-10-CM | POA: Diagnosis not present

## 2020-09-16 DIAGNOSIS — R531 Weakness: Secondary | ICD-10-CM | POA: Diagnosis not present

## 2020-09-20 DIAGNOSIS — Z9889 Other specified postprocedural states: Secondary | ICD-10-CM | POA: Diagnosis not present

## 2020-09-20 DIAGNOSIS — M25311 Other instability, right shoulder: Secondary | ICD-10-CM | POA: Diagnosis not present

## 2020-09-20 DIAGNOSIS — M25511 Pain in right shoulder: Secondary | ICD-10-CM | POA: Diagnosis not present

## 2020-09-20 DIAGNOSIS — R531 Weakness: Secondary | ICD-10-CM | POA: Diagnosis not present

## 2020-09-25 DIAGNOSIS — R531 Weakness: Secondary | ICD-10-CM | POA: Diagnosis not present

## 2020-09-25 DIAGNOSIS — M25311 Other instability, right shoulder: Secondary | ICD-10-CM | POA: Diagnosis not present

## 2020-09-25 DIAGNOSIS — Z9889 Other specified postprocedural states: Secondary | ICD-10-CM | POA: Diagnosis not present

## 2020-09-25 DIAGNOSIS — M25511 Pain in right shoulder: Secondary | ICD-10-CM | POA: Diagnosis not present

## 2020-09-30 DIAGNOSIS — M25311 Other instability, right shoulder: Secondary | ICD-10-CM | POA: Diagnosis not present

## 2020-09-30 DIAGNOSIS — Z9889 Other specified postprocedural states: Secondary | ICD-10-CM | POA: Diagnosis not present

## 2020-09-30 DIAGNOSIS — R531 Weakness: Secondary | ICD-10-CM | POA: Diagnosis not present

## 2020-09-30 DIAGNOSIS — M25511 Pain in right shoulder: Secondary | ICD-10-CM | POA: Diagnosis not present

## 2020-10-02 DIAGNOSIS — M25311 Other instability, right shoulder: Secondary | ICD-10-CM | POA: Diagnosis not present

## 2020-10-02 DIAGNOSIS — Z9889 Other specified postprocedural states: Secondary | ICD-10-CM | POA: Diagnosis not present

## 2020-10-02 DIAGNOSIS — M25511 Pain in right shoulder: Secondary | ICD-10-CM | POA: Diagnosis not present

## 2020-10-02 DIAGNOSIS — R531 Weakness: Secondary | ICD-10-CM | POA: Diagnosis not present

## 2020-10-09 DIAGNOSIS — M25511 Pain in right shoulder: Secondary | ICD-10-CM | POA: Diagnosis not present

## 2020-10-09 DIAGNOSIS — M25311 Other instability, right shoulder: Secondary | ICD-10-CM | POA: Diagnosis not present

## 2020-10-09 DIAGNOSIS — R531 Weakness: Secondary | ICD-10-CM | POA: Diagnosis not present

## 2020-10-09 DIAGNOSIS — Z9889 Other specified postprocedural states: Secondary | ICD-10-CM | POA: Diagnosis not present

## 2020-10-14 DIAGNOSIS — M25511 Pain in right shoulder: Secondary | ICD-10-CM | POA: Diagnosis not present

## 2020-10-14 DIAGNOSIS — R531 Weakness: Secondary | ICD-10-CM | POA: Diagnosis not present

## 2020-10-14 DIAGNOSIS — M25311 Other instability, right shoulder: Secondary | ICD-10-CM | POA: Diagnosis not present

## 2020-10-14 DIAGNOSIS — Z9889 Other specified postprocedural states: Secondary | ICD-10-CM | POA: Diagnosis not present

## 2020-10-16 DIAGNOSIS — M25511 Pain in right shoulder: Secondary | ICD-10-CM | POA: Diagnosis not present

## 2020-10-16 DIAGNOSIS — M25311 Other instability, right shoulder: Secondary | ICD-10-CM | POA: Diagnosis not present

## 2020-10-16 DIAGNOSIS — R531 Weakness: Secondary | ICD-10-CM | POA: Diagnosis not present

## 2020-10-16 DIAGNOSIS — Z9889 Other specified postprocedural states: Secondary | ICD-10-CM | POA: Diagnosis not present

## 2020-10-23 DIAGNOSIS — R531 Weakness: Secondary | ICD-10-CM | POA: Diagnosis not present

## 2020-10-23 DIAGNOSIS — Z9889 Other specified postprocedural states: Secondary | ICD-10-CM | POA: Diagnosis not present

## 2020-10-23 DIAGNOSIS — M25511 Pain in right shoulder: Secondary | ICD-10-CM | POA: Diagnosis not present

## 2020-10-23 DIAGNOSIS — M25311 Other instability, right shoulder: Secondary | ICD-10-CM | POA: Diagnosis not present

## 2020-10-28 DIAGNOSIS — R531 Weakness: Secondary | ICD-10-CM | POA: Diagnosis not present

## 2020-10-28 DIAGNOSIS — M25511 Pain in right shoulder: Secondary | ICD-10-CM | POA: Diagnosis not present

## 2020-10-28 DIAGNOSIS — M25311 Other instability, right shoulder: Secondary | ICD-10-CM | POA: Diagnosis not present

## 2020-10-28 DIAGNOSIS — M6530 Trigger finger, unspecified finger: Secondary | ICD-10-CM | POA: Diagnosis not present

## 2020-10-28 DIAGNOSIS — Z9889 Other specified postprocedural states: Secondary | ICD-10-CM | POA: Diagnosis not present

## 2020-10-30 DIAGNOSIS — R531 Weakness: Secondary | ICD-10-CM | POA: Diagnosis not present

## 2020-10-30 DIAGNOSIS — M25311 Other instability, right shoulder: Secondary | ICD-10-CM | POA: Diagnosis not present

## 2020-10-30 DIAGNOSIS — M25511 Pain in right shoulder: Secondary | ICD-10-CM | POA: Diagnosis not present

## 2020-10-30 DIAGNOSIS — Z9889 Other specified postprocedural states: Secondary | ICD-10-CM | POA: Diagnosis not present

## 2020-11-04 DIAGNOSIS — M25311 Other instability, right shoulder: Secondary | ICD-10-CM | POA: Diagnosis not present

## 2020-11-04 DIAGNOSIS — R531 Weakness: Secondary | ICD-10-CM | POA: Diagnosis not present

## 2020-11-04 DIAGNOSIS — M25511 Pain in right shoulder: Secondary | ICD-10-CM | POA: Diagnosis not present

## 2020-11-04 DIAGNOSIS — Z9889 Other specified postprocedural states: Secondary | ICD-10-CM | POA: Diagnosis not present

## 2020-11-06 DIAGNOSIS — M25511 Pain in right shoulder: Secondary | ICD-10-CM | POA: Diagnosis not present

## 2020-11-06 DIAGNOSIS — M25311 Other instability, right shoulder: Secondary | ICD-10-CM | POA: Diagnosis not present

## 2020-11-06 DIAGNOSIS — R531 Weakness: Secondary | ICD-10-CM | POA: Diagnosis not present

## 2020-11-06 DIAGNOSIS — Z9889 Other specified postprocedural states: Secondary | ICD-10-CM | POA: Diagnosis not present

## 2020-11-13 DIAGNOSIS — M25511 Pain in right shoulder: Secondary | ICD-10-CM | POA: Diagnosis not present

## 2020-11-13 DIAGNOSIS — M25311 Other instability, right shoulder: Secondary | ICD-10-CM | POA: Diagnosis not present

## 2020-11-13 DIAGNOSIS — Z9889 Other specified postprocedural states: Secondary | ICD-10-CM | POA: Diagnosis not present

## 2020-11-13 DIAGNOSIS — R531 Weakness: Secondary | ICD-10-CM | POA: Diagnosis not present

## 2020-11-15 DIAGNOSIS — Z9889 Other specified postprocedural states: Secondary | ICD-10-CM | POA: Diagnosis not present

## 2020-11-15 DIAGNOSIS — M25511 Pain in right shoulder: Secondary | ICD-10-CM | POA: Diagnosis not present

## 2020-11-15 DIAGNOSIS — R531 Weakness: Secondary | ICD-10-CM | POA: Diagnosis not present

## 2020-11-15 DIAGNOSIS — M25311 Other instability, right shoulder: Secondary | ICD-10-CM | POA: Diagnosis not present

## 2020-11-18 DIAGNOSIS — R531 Weakness: Secondary | ICD-10-CM | POA: Diagnosis not present

## 2020-11-18 DIAGNOSIS — M25311 Other instability, right shoulder: Secondary | ICD-10-CM | POA: Diagnosis not present

## 2020-11-18 DIAGNOSIS — Z9889 Other specified postprocedural states: Secondary | ICD-10-CM | POA: Diagnosis not present

## 2020-11-18 DIAGNOSIS — M25511 Pain in right shoulder: Secondary | ICD-10-CM | POA: Diagnosis not present

## 2020-11-20 DIAGNOSIS — R531 Weakness: Secondary | ICD-10-CM | POA: Diagnosis not present

## 2020-11-20 DIAGNOSIS — Z9889 Other specified postprocedural states: Secondary | ICD-10-CM | POA: Diagnosis not present

## 2020-11-20 DIAGNOSIS — M25311 Other instability, right shoulder: Secondary | ICD-10-CM | POA: Diagnosis not present

## 2020-11-20 DIAGNOSIS — M25511 Pain in right shoulder: Secondary | ICD-10-CM | POA: Diagnosis not present

## 2020-11-26 DIAGNOSIS — M25311 Other instability, right shoulder: Secondary | ICD-10-CM | POA: Diagnosis not present

## 2020-11-26 DIAGNOSIS — Z9889 Other specified postprocedural states: Secondary | ICD-10-CM | POA: Diagnosis not present

## 2020-11-26 DIAGNOSIS — M25511 Pain in right shoulder: Secondary | ICD-10-CM | POA: Diagnosis not present

## 2020-11-26 DIAGNOSIS — R531 Weakness: Secondary | ICD-10-CM | POA: Diagnosis not present

## 2020-11-28 DIAGNOSIS — R531 Weakness: Secondary | ICD-10-CM | POA: Diagnosis not present

## 2020-11-28 DIAGNOSIS — M25511 Pain in right shoulder: Secondary | ICD-10-CM | POA: Diagnosis not present

## 2020-11-28 DIAGNOSIS — M25311 Other instability, right shoulder: Secondary | ICD-10-CM | POA: Diagnosis not present

## 2020-11-28 DIAGNOSIS — Z9889 Other specified postprocedural states: Secondary | ICD-10-CM | POA: Diagnosis not present

## 2020-12-02 DIAGNOSIS — M25511 Pain in right shoulder: Secondary | ICD-10-CM | POA: Diagnosis not present

## 2020-12-02 DIAGNOSIS — Z9889 Other specified postprocedural states: Secondary | ICD-10-CM | POA: Diagnosis not present

## 2020-12-02 DIAGNOSIS — R531 Weakness: Secondary | ICD-10-CM | POA: Diagnosis not present

## 2020-12-02 DIAGNOSIS — M25311 Other instability, right shoulder: Secondary | ICD-10-CM | POA: Diagnosis not present

## 2020-12-05 DIAGNOSIS — M25511 Pain in right shoulder: Secondary | ICD-10-CM | POA: Diagnosis not present

## 2020-12-05 DIAGNOSIS — Z9889 Other specified postprocedural states: Secondary | ICD-10-CM | POA: Diagnosis not present

## 2020-12-05 DIAGNOSIS — R531 Weakness: Secondary | ICD-10-CM | POA: Diagnosis not present

## 2020-12-05 DIAGNOSIS — M25311 Other instability, right shoulder: Secondary | ICD-10-CM | POA: Diagnosis not present

## 2020-12-06 DIAGNOSIS — M65311 Trigger thumb, right thumb: Secondary | ICD-10-CM | POA: Diagnosis not present

## 2020-12-22 DIAGNOSIS — R059 Cough, unspecified: Secondary | ICD-10-CM | POA: Diagnosis not present

## 2020-12-22 DIAGNOSIS — Z20822 Contact with and (suspected) exposure to covid-19: Secondary | ICD-10-CM | POA: Diagnosis not present

## 2020-12-22 DIAGNOSIS — Z03818 Encounter for observation for suspected exposure to other biological agents ruled out: Secondary | ICD-10-CM | POA: Diagnosis not present

## 2020-12-22 DIAGNOSIS — J029 Acute pharyngitis, unspecified: Secondary | ICD-10-CM | POA: Diagnosis not present

## 2021-01-04 DIAGNOSIS — Z131 Encounter for screening for diabetes mellitus: Secondary | ICD-10-CM | POA: Diagnosis not present

## 2021-01-04 DIAGNOSIS — J302 Other seasonal allergic rhinitis: Secondary | ICD-10-CM | POA: Diagnosis not present

## 2021-01-04 DIAGNOSIS — D539 Nutritional anemia, unspecified: Secondary | ICD-10-CM | POA: Diagnosis not present

## 2021-01-04 DIAGNOSIS — Z1339 Encounter for screening examination for other mental health and behavioral disorders: Secondary | ICD-10-CM | POA: Diagnosis not present

## 2021-01-04 DIAGNOSIS — Z1159 Encounter for screening for other viral diseases: Secondary | ICD-10-CM | POA: Diagnosis not present

## 2021-01-04 DIAGNOSIS — E559 Vitamin D deficiency, unspecified: Secondary | ICD-10-CM | POA: Diagnosis not present

## 2021-01-04 DIAGNOSIS — F17210 Nicotine dependence, cigarettes, uncomplicated: Secondary | ICD-10-CM | POA: Diagnosis not present

## 2021-01-04 DIAGNOSIS — R5383 Other fatigue: Secondary | ICD-10-CM | POA: Diagnosis not present

## 2021-01-04 DIAGNOSIS — Z114 Encounter for screening for human immunodeficiency virus [HIV]: Secondary | ICD-10-CM | POA: Diagnosis not present

## 2021-01-04 DIAGNOSIS — N951 Menopausal and female climacteric states: Secondary | ICD-10-CM | POA: Diagnosis not present

## 2021-01-04 DIAGNOSIS — I10 Essential (primary) hypertension: Secondary | ICD-10-CM | POA: Diagnosis not present

## 2021-01-04 DIAGNOSIS — E669 Obesity, unspecified: Secondary | ICD-10-CM | POA: Diagnosis not present

## 2021-01-04 DIAGNOSIS — Z13220 Encounter for screening for lipoid disorders: Secondary | ICD-10-CM | POA: Diagnosis not present

## 2021-01-04 DIAGNOSIS — Z136 Encounter for screening for cardiovascular disorders: Secondary | ICD-10-CM | POA: Diagnosis not present

## 2021-01-04 DIAGNOSIS — Z13228 Encounter for screening for other metabolic disorders: Secondary | ICD-10-CM | POA: Diagnosis not present

## 2021-01-04 DIAGNOSIS — Z0000 Encounter for general adult medical examination without abnormal findings: Secondary | ICD-10-CM | POA: Diagnosis not present

## 2021-01-04 DIAGNOSIS — R350 Frequency of micturition: Secondary | ICD-10-CM | POA: Diagnosis not present

## 2021-01-04 DIAGNOSIS — E8881 Metabolic syndrome: Secondary | ICD-10-CM | POA: Diagnosis not present

## 2021-01-04 DIAGNOSIS — Z87891 Personal history of nicotine dependence: Secondary | ICD-10-CM | POA: Diagnosis not present

## 2021-01-04 DIAGNOSIS — Z79899 Other long term (current) drug therapy: Secondary | ICD-10-CM | POA: Diagnosis not present

## 2021-01-28 DIAGNOSIS — E049 Nontoxic goiter, unspecified: Secondary | ICD-10-CM | POA: Diagnosis not present

## 2021-02-04 DIAGNOSIS — M65311 Trigger thumb, right thumb: Secondary | ICD-10-CM | POA: Diagnosis not present

## 2021-02-04 DIAGNOSIS — F17210 Nicotine dependence, cigarettes, uncomplicated: Secondary | ICD-10-CM | POA: Diagnosis not present

## 2021-02-04 DIAGNOSIS — E7800 Pure hypercholesterolemia, unspecified: Secondary | ICD-10-CM | POA: Diagnosis not present

## 2021-02-04 DIAGNOSIS — J302 Other seasonal allergic rhinitis: Secondary | ICD-10-CM | POA: Diagnosis not present

## 2021-02-04 DIAGNOSIS — E669 Obesity, unspecified: Secondary | ICD-10-CM | POA: Diagnosis not present

## 2021-02-04 DIAGNOSIS — I10 Essential (primary) hypertension: Secondary | ICD-10-CM | POA: Diagnosis not present

## 2021-02-17 DIAGNOSIS — N898 Other specified noninflammatory disorders of vagina: Secondary | ICD-10-CM | POA: Diagnosis not present

## 2021-02-17 DIAGNOSIS — E669 Obesity, unspecified: Secondary | ICD-10-CM | POA: Diagnosis not present

## 2021-02-17 DIAGNOSIS — E041 Nontoxic single thyroid nodule: Secondary | ICD-10-CM | POA: Diagnosis not present

## 2021-02-17 DIAGNOSIS — R350 Frequency of micturition: Secondary | ICD-10-CM | POA: Diagnosis not present

## 2021-05-13 DIAGNOSIS — E042 Nontoxic multinodular goiter: Secondary | ICD-10-CM | POA: Diagnosis not present

## 2021-06-20 DIAGNOSIS — M19071 Primary osteoarthritis, right ankle and foot: Secondary | ICD-10-CM | POA: Diagnosis not present

## 2021-07-01 DIAGNOSIS — E8881 Metabolic syndrome: Secondary | ICD-10-CM | POA: Diagnosis not present

## 2021-07-01 DIAGNOSIS — D539 Nutritional anemia, unspecified: Secondary | ICD-10-CM | POA: Diagnosis not present

## 2021-07-01 DIAGNOSIS — E669 Obesity, unspecified: Secondary | ICD-10-CM | POA: Diagnosis not present

## 2021-07-01 DIAGNOSIS — F17210 Nicotine dependence, cigarettes, uncomplicated: Secondary | ICD-10-CM | POA: Diagnosis not present

## 2021-07-01 DIAGNOSIS — R5383 Other fatigue: Secondary | ICD-10-CM | POA: Diagnosis not present

## 2021-07-01 DIAGNOSIS — R7309 Other abnormal glucose: Secondary | ICD-10-CM | POA: Diagnosis not present

## 2021-07-01 DIAGNOSIS — I10 Essential (primary) hypertension: Secondary | ICD-10-CM | POA: Diagnosis not present

## 2021-07-01 DIAGNOSIS — E049 Nontoxic goiter, unspecified: Secondary | ICD-10-CM | POA: Diagnosis not present

## 2021-07-01 DIAGNOSIS — E7800 Pure hypercholesterolemia, unspecified: Secondary | ICD-10-CM | POA: Diagnosis not present

## 2021-07-01 DIAGNOSIS — Z79899 Other long term (current) drug therapy: Secondary | ICD-10-CM | POA: Diagnosis not present

## 2021-07-01 DIAGNOSIS — J302 Other seasonal allergic rhinitis: Secondary | ICD-10-CM | POA: Diagnosis not present

## 2021-07-01 DIAGNOSIS — Z1159 Encounter for screening for other viral diseases: Secondary | ICD-10-CM | POA: Diagnosis not present

## 2021-09-11 DIAGNOSIS — M19071 Primary osteoarthritis, right ankle and foot: Secondary | ICD-10-CM | POA: Diagnosis not present

## 2021-09-11 DIAGNOSIS — S86391A Other injury of muscle(s) and tendon(s) of peroneal muscle group at lower leg level, right leg, initial encounter: Secondary | ICD-10-CM | POA: Diagnosis not present

## 2021-09-11 DIAGNOSIS — M25371 Other instability, right ankle: Secondary | ICD-10-CM | POA: Diagnosis not present

## 2021-09-19 DIAGNOSIS — M25571 Pain in right ankle and joints of right foot: Secondary | ICD-10-CM | POA: Diagnosis not present

## 2021-12-25 IMAGING — MR MR SHOULDER*R* W/O CM
4 of 5 series · 29 of 40 positions shown · non-contrast
Comparison: Plain films right shoulder 06/15/2020.

CLINICAL DATA: Right shoulder pain and limited range of motion for
3 weeks since the patient post back and heard a pop in the shoulder.

EXAM:
MRI OF THE RIGHT SHOULDER WITHOUT CONTRAST
TECHNIQUE: Multiplanar, multisequence MR imaging of the shoulder was performed.
No intravenous contrast was administered.

[Series 3: PD fat-sat · axial · 4.0mm · 0.27mm/px · z∈[-69,+34]mm · 8 of 23 slices shown (1 of 2)]
[im 1/23]
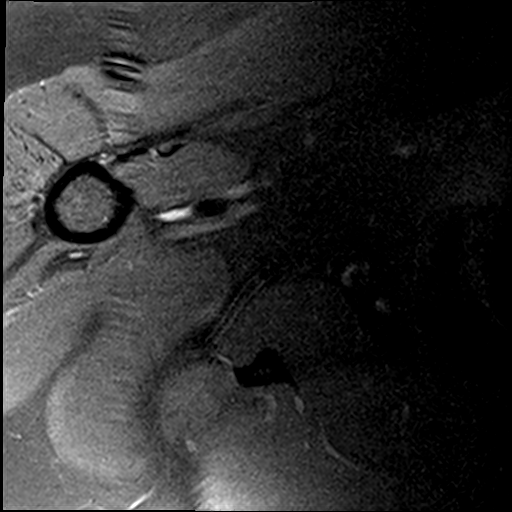
[im 3/23]
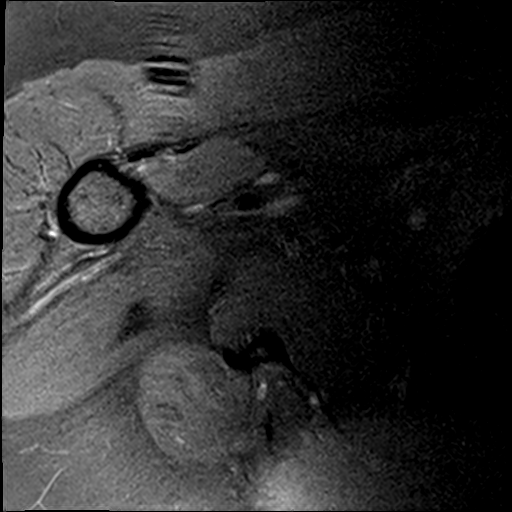
[im 8/23]
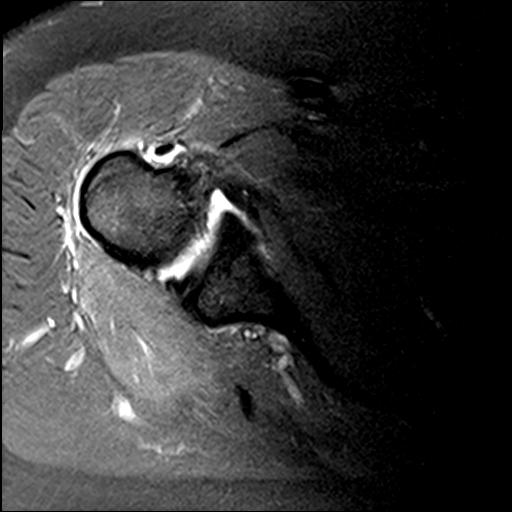
[im 10/23]
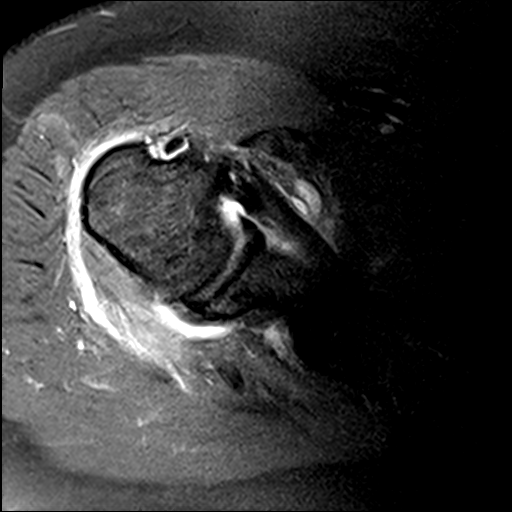
[im 13/23]
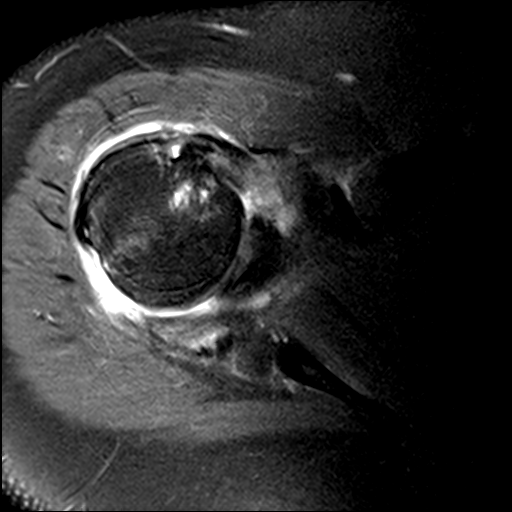
[im 15/23]
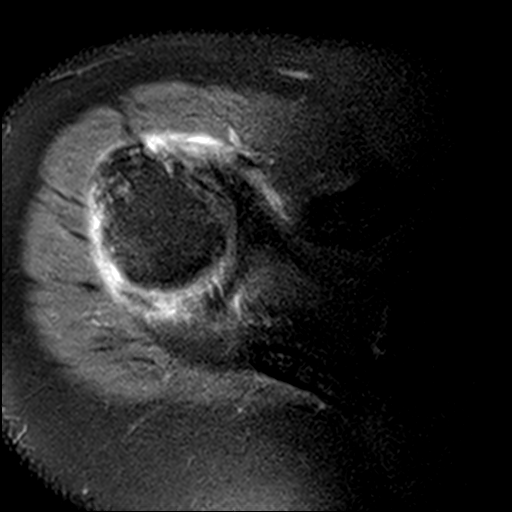
[im 20/23]
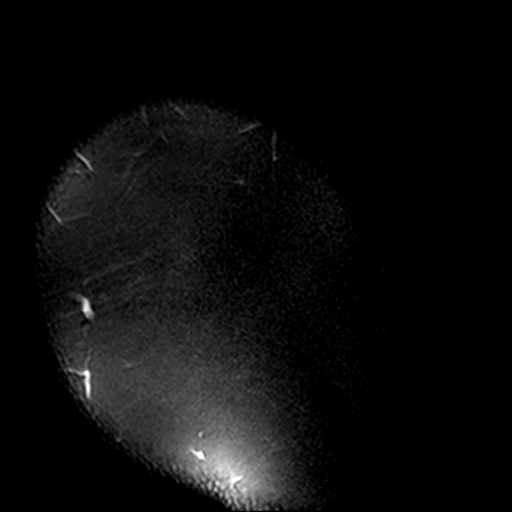
[im 23/23]
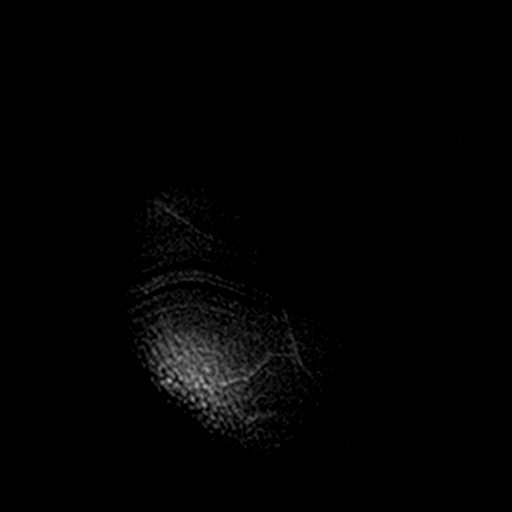

[Series 4: PD fat-sat · oblique · 4.0mm · 0.27mm/px · 7 of 18 slices shown (2 of 2)]
[im 1/18]
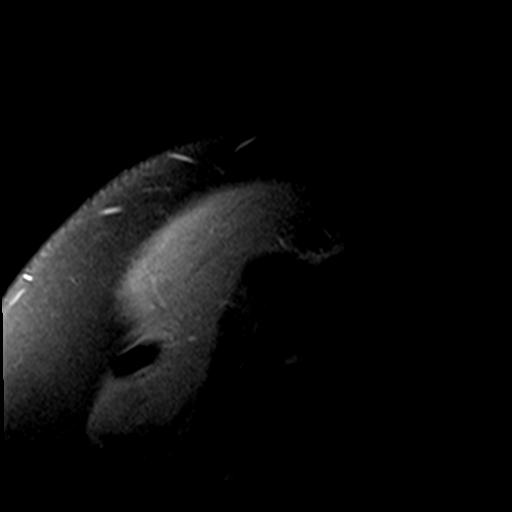
[im 3/18]
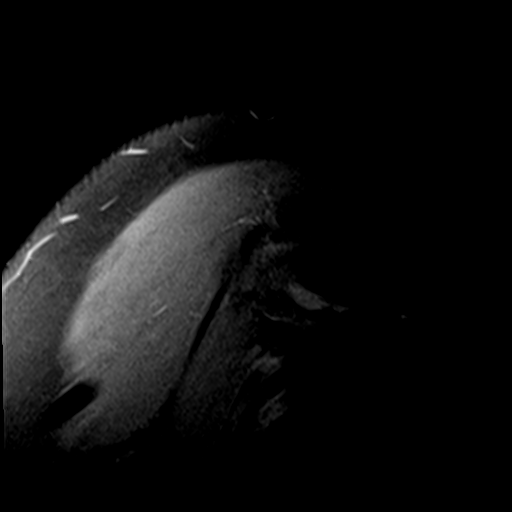
[im 6/18]
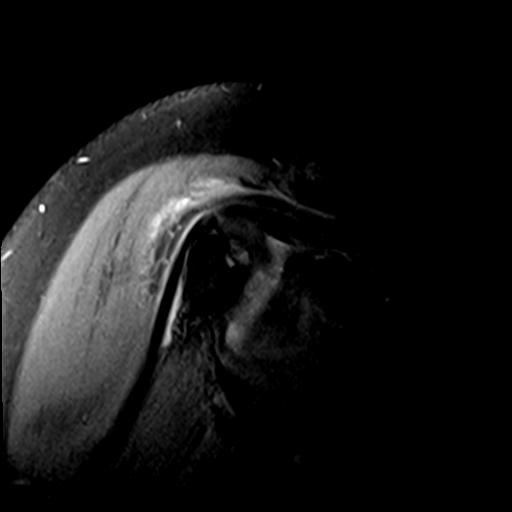
[im 9/18]
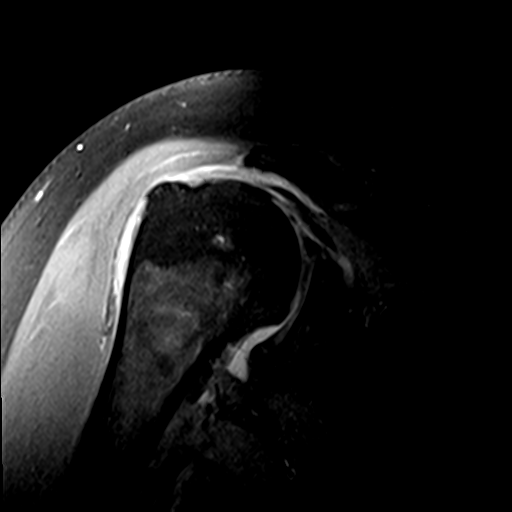
[im 12/18]
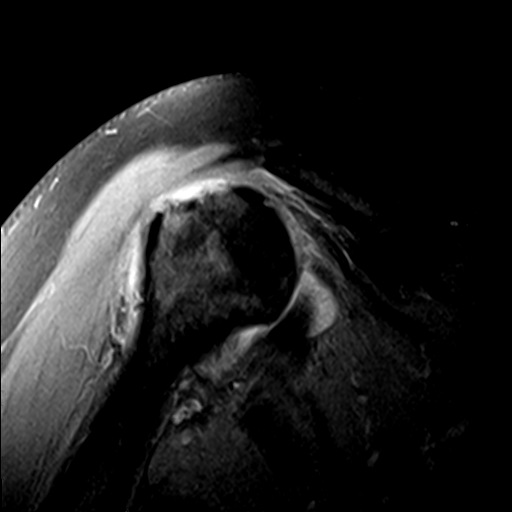
[im 15/18]
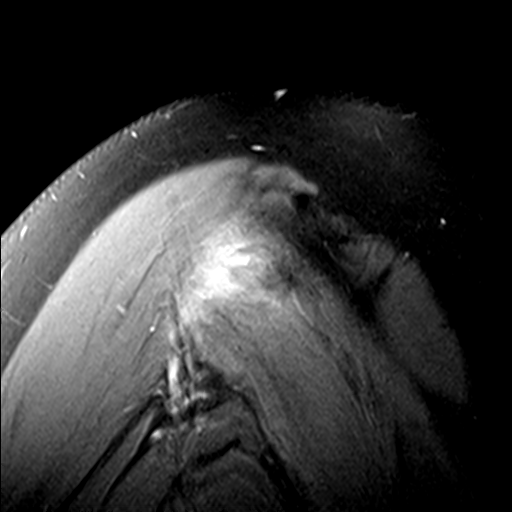
[im 18/18]
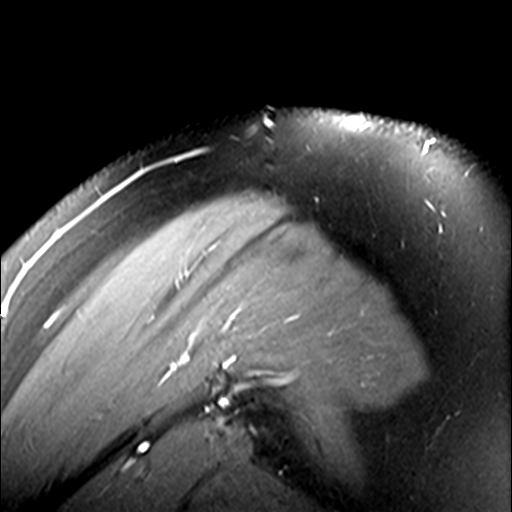

[Series 5: T2 fat-sat · oblique · 4.0mm · 0.55mm/px · 7 of 18 slices shown (1 of 2)]
[im 1/18]
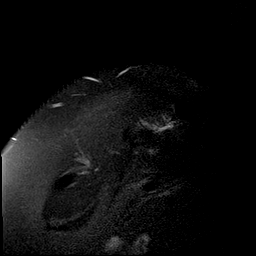
[im 3/18]
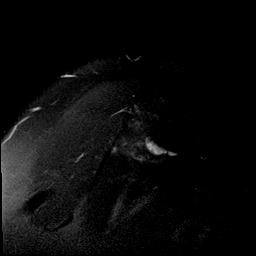
[im 6/18]
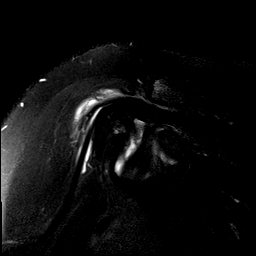
[im 9/18]
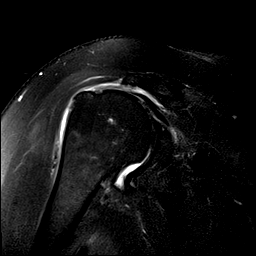
[im 12/18]
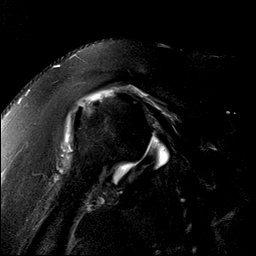
[im 15/18]
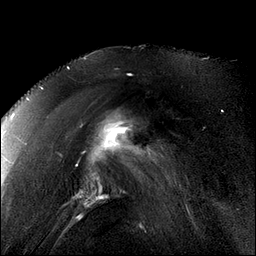
[im 18/18]
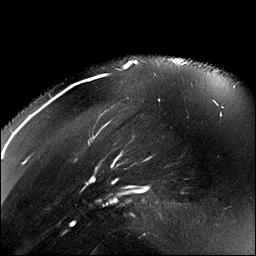

[Series 6: T2 fat-sat · oblique · 4.0mm · 0.55mm/px · 7 of 19 slices shown (2 of 2)]
[im 1/19]
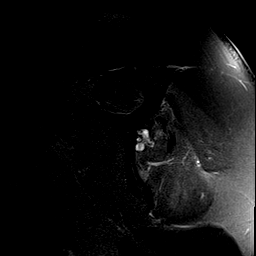
[im 3/19]
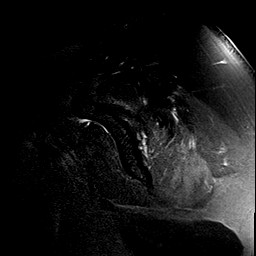
[im 6/19]
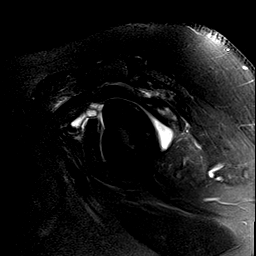
[im 8/19]
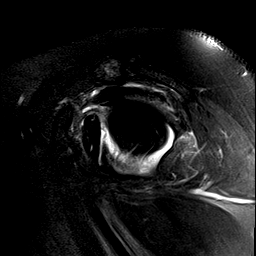
[im 11/19]
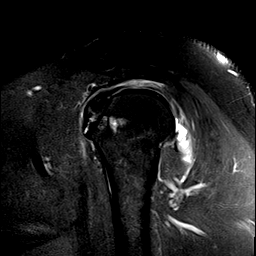
[im 13/19]
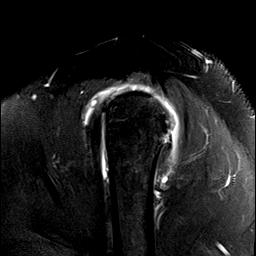
[im 16/19]
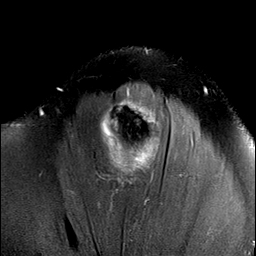

[29 of 40 positions shown; findings below may reference images not displayed]

FINDINGS: Rotator cuff: The patient has rotator cuff tendinopathy. The
supraspinatus and infraspinatus are both completely torn and
retracted 2-3 cm.

Muscles:  Normal without atrophy or focal lesion.

Biceps long head: Intact. There is tendinopathy of the
intra-articular segment.

Acromioclavicular Joint: Moderate osteoarthritis. Type 2 acromion.
There is fluid in the subacromial/subdeltoid bursa.

Glenohumeral Joint: Negative.

Labrum: The superior labrum is frayed and degenerated without
discrete tear identified.

Bones: No fracture, contusion or worrisome lesion. Small lucency in
the humeral head seen on the prior plain films is due to a
degenerative cyst.

Other: None.
IMPRESSION: Complete supraspinatus and infraspinatus tendon tears with 2-3 cm of
retraction. No atrophy.

Tendinopathy of the intra-articular long head of biceps and
subscapularis without tear.

Moderate acromioclavicular osteoarthritis.

Subacromial/subdeltoid bursitis.

## 2022-01-21 IMAGING — US US THYROID
1 series · 13 of 25 positions shown · non-contrast
Comparison: None.

CLINICAL DATA: Goiter.

EXAM:
THYROID ULTRASOUND
TECHNIQUE: Ultrasound examination of the thyroid gland and adjacent soft
tissues was performed.

[Series 1: us thyroid · 0.06mm/px · 13 of 39 slices shown]
[im 1/39]
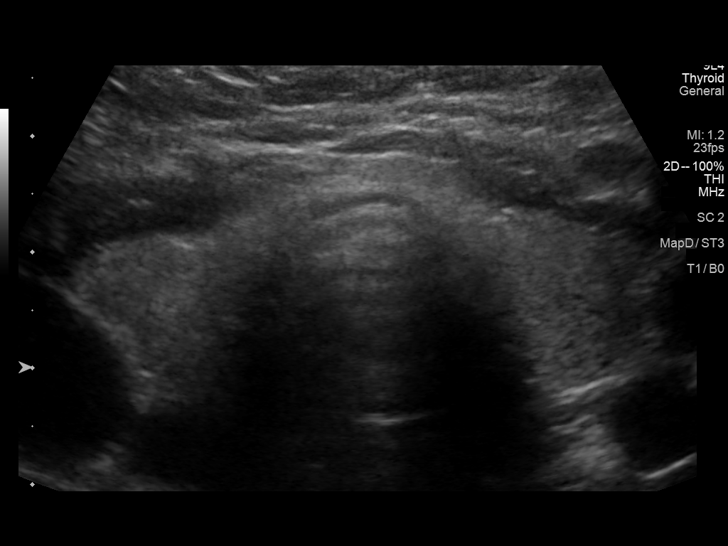
[im 4/39]
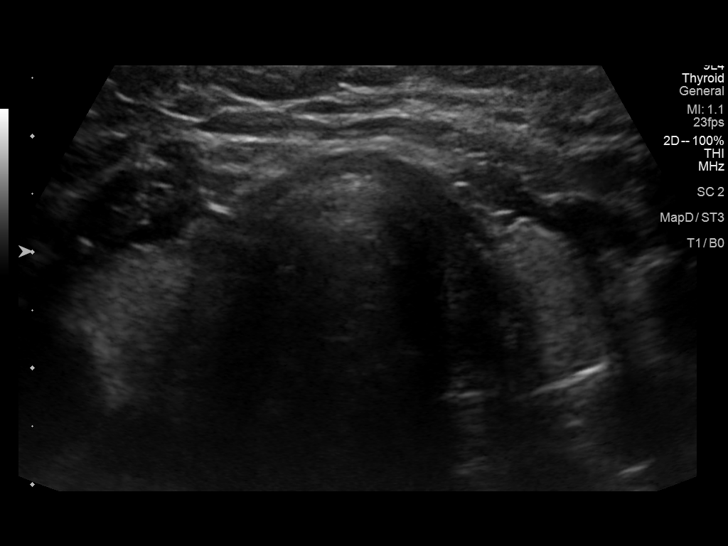
[im 7/39]
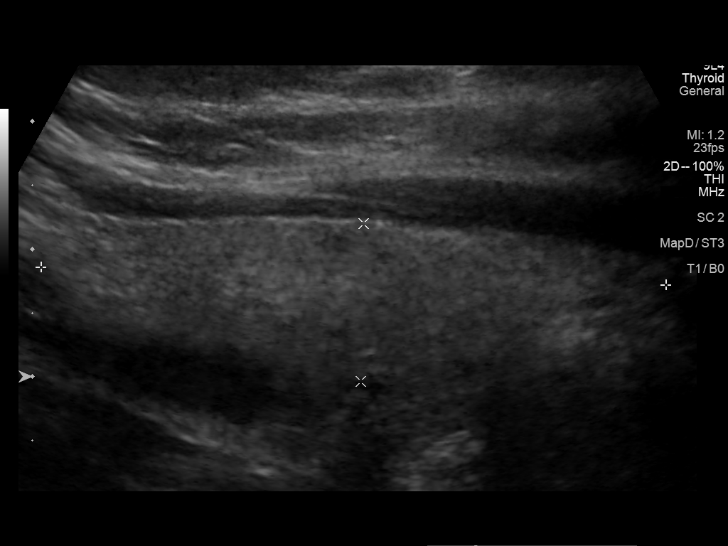
[im 10/39]
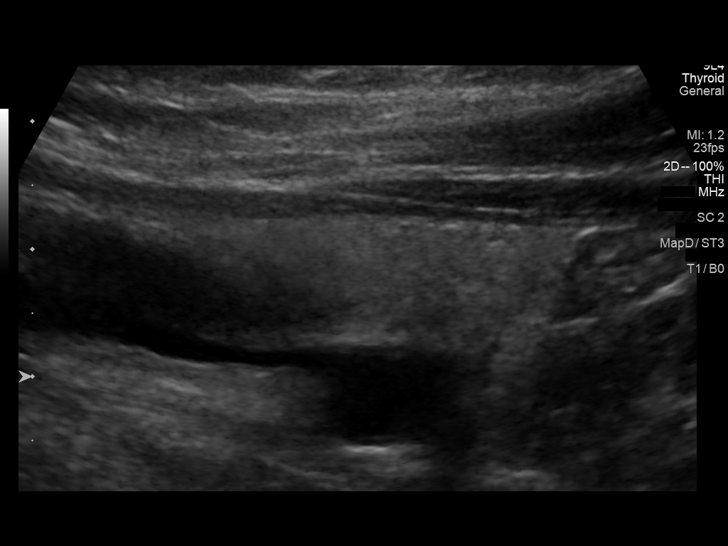
[im 13/39]
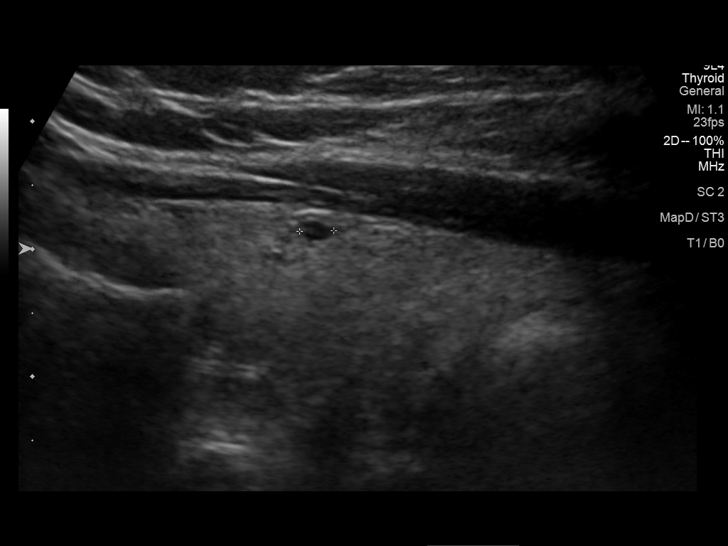
[im 16/39]
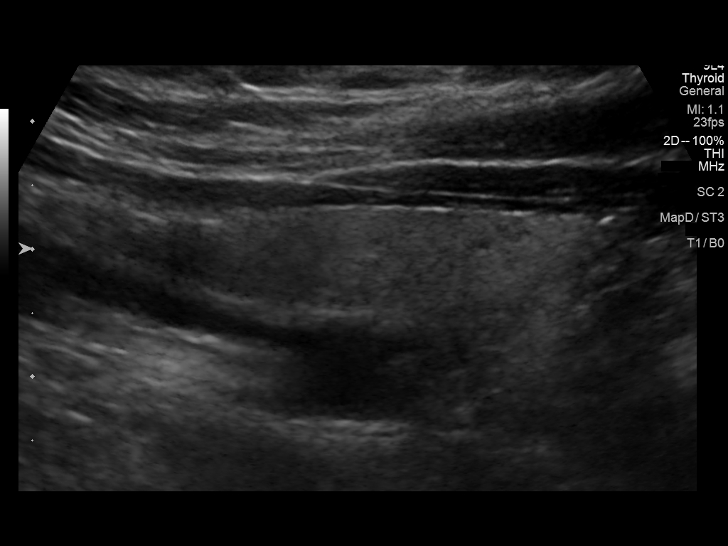
[im 20/39]
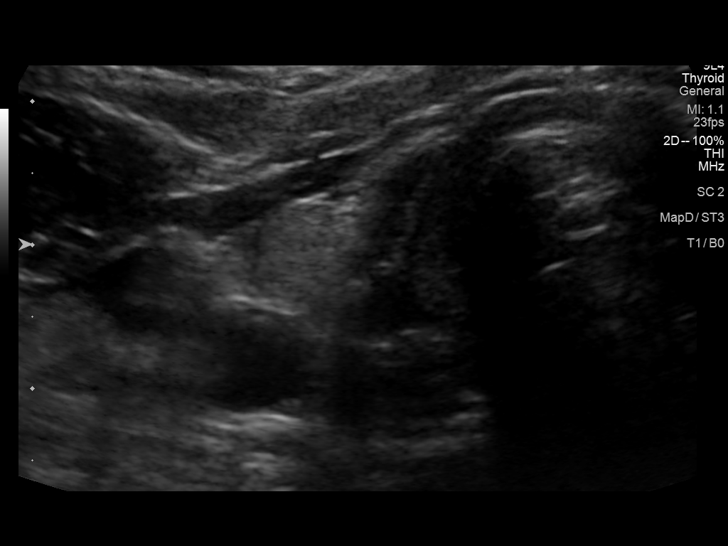
[im 23/39]
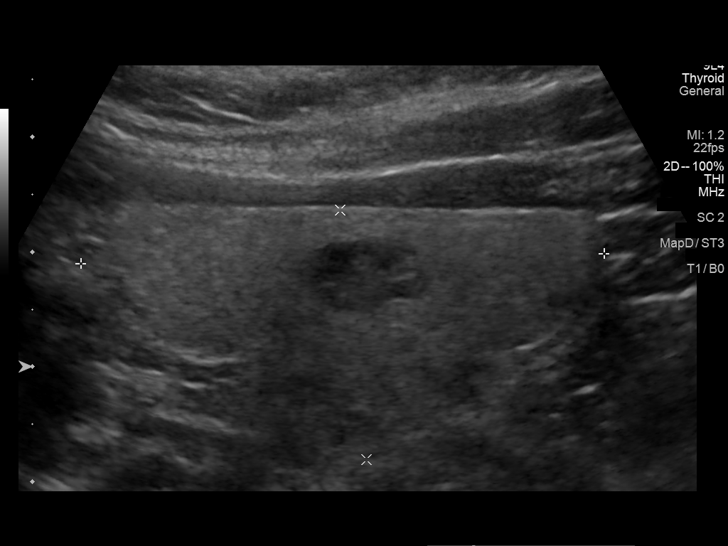
[im 26/39]
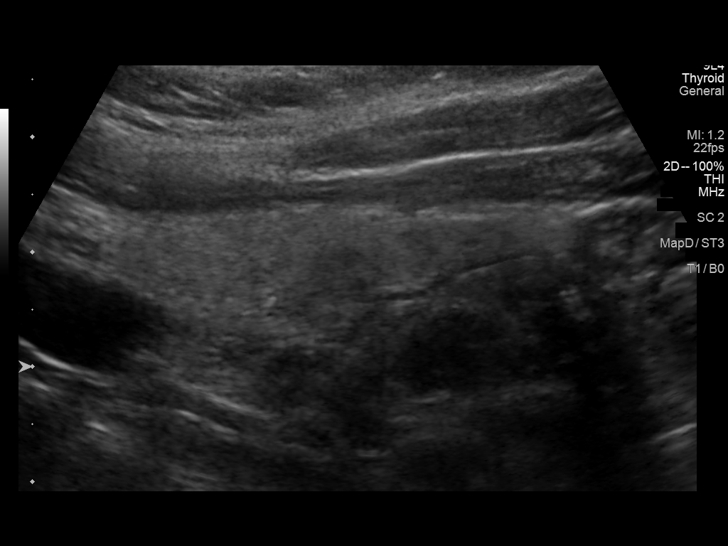
[im 29/39]
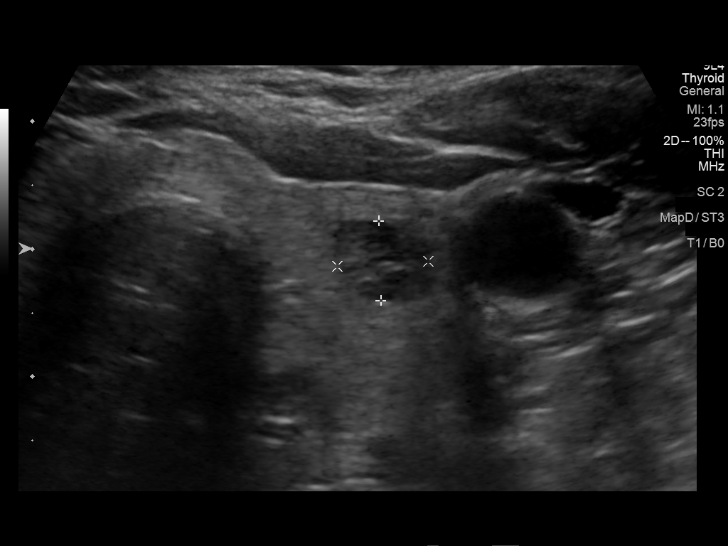
[im 32/39]
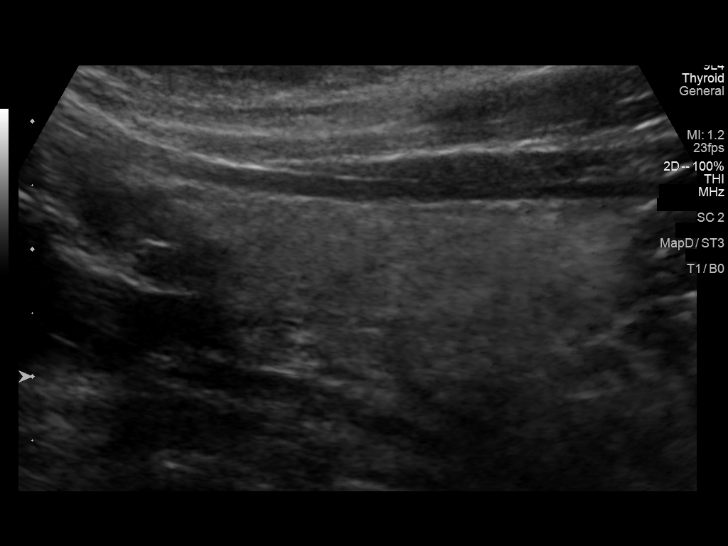
[im 35/39]
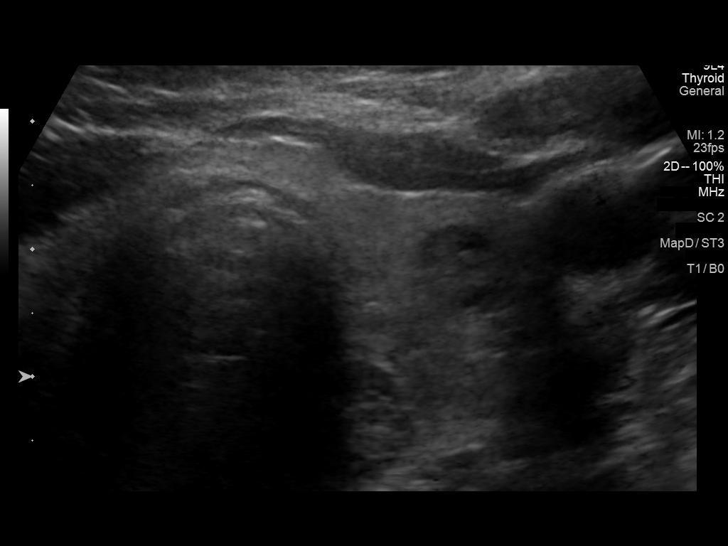
[im 39/39]
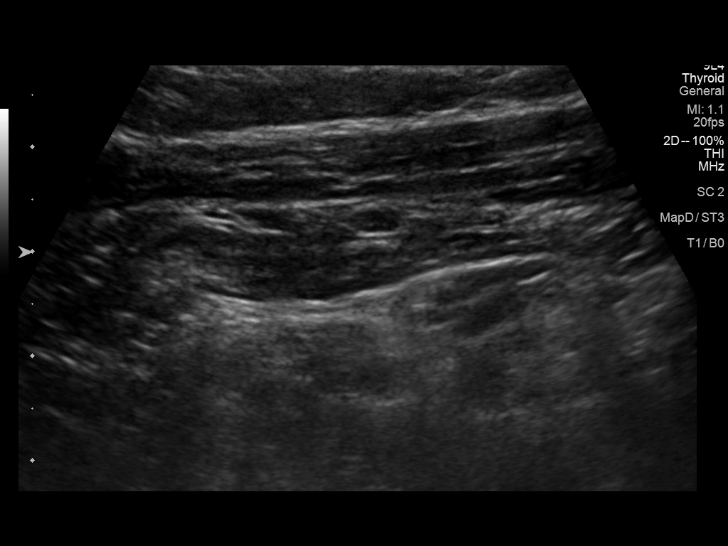

[13 of 25 positions shown; findings below may reference images not displayed]

FINDINGS: Parenchymal Echotexture: Mildly heterogenous

Isthmus: 0.2 cm

Right lobe: 4.9 x 1.2 x 1.7 cm

Left lobe: 4.6 x 2.2 x 1.5 cm

_________________________________________________________

Estimated total number of nodules >/= 1 cm: 0

Number of spongiform nodules >/=  2 cm not described below (TR1): 0

Number of mixed cystic and solid nodules >/= 1.5 cm not described
below (TR2): 0

_________________________________________________________

No discrete nodules of consequence are seen within the thyroid
gland. Incidentally, there is a 0.9 cm hypoechoic nodule in the left
mid gland with multiple linear calcifications likely representing
reflections off of small cysts within this presumably spongiform
nodule. This lesion does not meet criteria to consider biopsy or
dedicated imaging follow-up. No further follow-up required.
IMPRESSION: Diffusely mildly heterogeneous thyroid gland. No thyroid nodules
requiring further evaluation are identified.

The above is in keeping with the ACR TI-RADS recommendations - [HOSPITAL] 7133;[DATE].

## 2022-09-14 ENCOUNTER — Other Ambulatory Visit (HOSPITAL_BASED_OUTPATIENT_CLINIC_OR_DEPARTMENT_OTHER): Payer: Self-pay | Admitting: Internal Medicine

## 2022-09-14 DIAGNOSIS — Z1231 Encounter for screening mammogram for malignant neoplasm of breast: Secondary | ICD-10-CM

## 2022-09-16 ENCOUNTER — Other Ambulatory Visit (HOSPITAL_BASED_OUTPATIENT_CLINIC_OR_DEPARTMENT_OTHER): Payer: Self-pay | Admitting: Nurse Practitioner

## 2022-09-16 ENCOUNTER — Ambulatory Visit (HOSPITAL_BASED_OUTPATIENT_CLINIC_OR_DEPARTMENT_OTHER)
Admission: RE | Admit: 2022-09-16 | Discharge: 2022-09-16 | Disposition: A | Discharge status: Home / Self Care | Payer: BC Managed Care – PPO | Source: Ambulatory Visit | Attending: Internal Medicine | Admitting: Internal Medicine

## 2022-09-16 DIAGNOSIS — Z1231 Encounter for screening mammogram for malignant neoplasm of breast: Secondary | ICD-10-CM
